# Patient Record
Sex: Male | Born: 1955 | Race: White | Hispanic: No | Marital: Married | Smoking: Former smoker
Health system: Southern US, Community
[De-identification: ages and names within clinical notes are randomized; demographics above are authoritative.]

## PROBLEM LIST (undated history)

## (undated) DIAGNOSIS — R42 Dizziness and giddiness: Secondary | ICD-10-CM

## (undated) DIAGNOSIS — H8110 Benign paroxysmal vertigo, unspecified ear: Secondary | ICD-10-CM

## (undated) DIAGNOSIS — K449 Diaphragmatic hernia without obstruction or gangrene: Secondary | ICD-10-CM

## (undated) DIAGNOSIS — I10 Essential (primary) hypertension: Secondary | ICD-10-CM

## (undated) DIAGNOSIS — M109 Gout, unspecified: Secondary | ICD-10-CM

## (undated) HISTORY — PX: TONSILLECTOMY: SUR1361

## (undated) HISTORY — DX: Benign paroxysmal vertigo, unspecified ear: H81.10

---

## 1999-07-18 ENCOUNTER — Encounter: Payer: Self-pay | Admitting: Family Medicine

## 1999-07-18 ENCOUNTER — Ambulatory Visit (HOSPITAL_COMMUNITY): Admission: RE | Admit: 1999-07-18 | Discharge: 1999-07-18 | Payer: Self-pay | Admitting: Family Medicine

## 2000-03-12 ENCOUNTER — Other Ambulatory Visit: Admission: RE | Admit: 2000-03-12 | Discharge: 2000-03-12 | Payer: Self-pay | Admitting: Otolaryngology

## 2000-03-12 ENCOUNTER — Encounter (INDEPENDENT_AMBULATORY_CARE_PROVIDER_SITE_OTHER): Payer: Self-pay | Admitting: Specialist

## 2002-11-19 ENCOUNTER — Encounter: Payer: Self-pay | Admitting: Emergency Medicine

## 2002-11-19 ENCOUNTER — Emergency Department (HOSPITAL_COMMUNITY): Admission: EM | Admit: 2002-11-19 | Discharge: 2002-11-19 | Payer: Self-pay | Admitting: Emergency Medicine

## 2003-10-23 ENCOUNTER — Ambulatory Visit (HOSPITAL_COMMUNITY): Admission: RE | Admit: 2003-10-23 | Discharge: 2003-10-23 | Payer: Self-pay | Admitting: Family Medicine

## 2007-07-27 ENCOUNTER — Encounter: Admission: RE | Admit: 2007-07-27 | Discharge: 2007-07-27 | Payer: Self-pay | Admitting: Family Medicine

## 2008-11-03 ENCOUNTER — Encounter: Admission: RE | Admit: 2008-11-03 | Discharge: 2008-11-03 | Payer: Self-pay | Admitting: Orthopedic Surgery

## 2010-07-28 ENCOUNTER — Encounter: Payer: Self-pay | Admitting: Family Medicine

## 2011-10-27 ENCOUNTER — Other Ambulatory Visit: Payer: Self-pay | Admitting: Family Medicine

## 2011-10-27 DIAGNOSIS — M542 Cervicalgia: Secondary | ICD-10-CM

## 2011-10-30 ENCOUNTER — Ambulatory Visit
Admission: RE | Admit: 2011-10-30 | Discharge: 2011-10-30 | Disposition: A | Discharge status: Home / Self Care | Payer: BC Managed Care – PPO | Source: Ambulatory Visit | Attending: Family Medicine | Admitting: Family Medicine

## 2011-10-30 DIAGNOSIS — M542 Cervicalgia: Secondary | ICD-10-CM

## 2015-02-18 ENCOUNTER — Encounter (HOSPITAL_COMMUNITY): Payer: Self-pay

## 2015-02-18 ENCOUNTER — Emergency Department (HOSPITAL_COMMUNITY)
Admission: EM | Admit: 2015-02-18 | Discharge: 2015-02-19 | Disposition: A | Discharge status: Home / Self Care | Payer: 59 | Attending: Emergency Medicine | Admitting: Emergency Medicine

## 2015-02-18 DIAGNOSIS — Z8739 Personal history of other diseases of the musculoskeletal system and connective tissue: Secondary | ICD-10-CM | POA: Diagnosis not present

## 2015-02-18 DIAGNOSIS — R109 Unspecified abdominal pain: Secondary | ICD-10-CM | POA: Diagnosis present

## 2015-02-18 DIAGNOSIS — K219 Gastro-esophageal reflux disease without esophagitis: Secondary | ICD-10-CM | POA: Insufficient documentation

## 2015-02-18 DIAGNOSIS — I10 Essential (primary) hypertension: Secondary | ICD-10-CM | POA: Insufficient documentation

## 2015-02-18 DIAGNOSIS — R079 Chest pain, unspecified: Secondary | ICD-10-CM | POA: Insufficient documentation

## 2015-02-18 HISTORY — DX: Dizziness and giddiness: R42

## 2015-02-18 HISTORY — DX: Diaphragmatic hernia without obstruction or gangrene: K44.9

## 2015-02-18 HISTORY — DX: Gout, unspecified: M10.9

## 2015-02-18 HISTORY — DX: Essential (primary) hypertension: I10

## 2015-02-18 NOTE — ED Provider Notes (Signed)
CSN: [id number]   Arrival date & time 02/18/15 2346  History  This chart was scribed for  Marry Kusch, MD by Altamease Oiler, ED Scribe. This patient was seen in room A02C/A02C and the patient's care was started at 12:04 AM.  Chief Complaint  Patient presents with  . Abdominal Pain  . Chest Pain    HPI Patient is a 59 y.o. male presenting with chest pain. The history is provided by the patient. No language interpreter was used.  Chest Pain Chest pain location: Central chest. Pain quality: pressure   Pain radiates to:  Does not radiate Pain radiates to the back: no   Pain severity:  Moderate Onset quality:  Sudden Duration:  2 hours Timing:  Intermittent Progression:  Improving Chronicity:  New Context: eating   Relieved by:  Nothing Worsened by:  Nothing tried Associated symptoms: abdominal pain   Associated symptoms: no nausea, no palpitations, no shortness of breath and not vomiting   Risk factors: male sex    Richard Mccann is a 59 y.o. male with PMHx of hiatal hernia who presents to the Emergency Department complaining of central chest pain with sudden tonight around 10 PM. Pt ate BBQ wings, a salad, and a hamburger with cheese and bacon tonight around 7:30 PM. He notes that after eating he went home and was preparing for bed by taking an Advil Cold/Sinus for ongoing headaches and the pain started while he was swallowing water. He states that the pain "felt like my hernia" and describes it as intermittent pressure. He attempted to force emesis to relieve the pressure but was only able to pass clear mucous. Pt uses Prilosec daily. Associated symptoms include abdominal bloating and epigastric pain. Pt denies constipation but states that he has had some difficulty with bowel movements over the last several days.   Past Medical History  Diagnosis Date  . Gout   . Hiatal hernia   . Hypertension   . Vertigo     Past Surgical History  Procedure Laterality Date  . Tonsillectomy       History reviewed. No pertinent family history.  Social History  Substance Use Topics  . Smoking status: Never Smoker   . Smokeless tobacco: Never Used  . Alcohol Use: No     Review of Systems  Respiratory: Negative for shortness of breath.   Cardiovascular: Positive for chest pain. Negative for palpitations and leg swelling.  Gastrointestinal: Positive for abdominal pain. Negative for nausea, vomiting and constipation.  All other systems reviewed and are negative.    Home Medications   Prior to Admission medications   Not on File    Allergies  Allopurinol  Triage Vitals: BP 133/78 mmHg  Pulse 62  Temp(Src) 98 F (36.7 C)  Resp 19  Ht 5\' 7"  (1.702 m)  Wt 200 lb (90.719 kg)  BMI 31.32 kg/m2  SpO2 96%  Physical Exam  Constitutional: He is oriented to person, place, and time. He appears well-developed and well-nourished.  HENT:  Head: Normocephalic.  Mouth/Throat: Oropharynx is clear and moist. No oropharyngeal exudate.  Moist mucous membranes  Eyes: EOM are normal. Pupils are equal, round, and reactive to light.  Neck: Normal range of motion. Neck supple.  Cardiovascular: Normal rate and regular rhythm.   Pulmonary/Chest: Effort normal and breath sounds normal. No respiratory distress. He has no wheezes. He has no rales.  Abdominal: Soft. He exhibits distension. He exhibits no mass. Bowel sounds are increased. There is no tenderness. There is  no rebound and no guarding.  Abdominal sounds into the thoracic cavity  Musculoskeletal: Normal range of motion.  Neurological: He is alert and oriented to person, place, and time.  Skin: Skin is warm and dry.  Psychiatric: He has a normal mood and affect.  Nursing note and vitals reviewed.   ED Course  Procedures   DIAGNOSTIC STUDIES: Oxygen Saturation is 96% on RA, normal by my interpretation.    COORDINATION OF CARE: 12:10 AM Discussed treatment plan which includes CXR, abdominal XR, EKG, lab work, and GI  cocktail  with pt at bedside and pt agreed to plan.  Labs Review-  Labs Reviewed  I-STAT CHEM 8, ED - Abnormal; Notable for the following:    BUN 26 (*)    Creatinine, Ser 1.50 (*)    Glucose, Bld 120 (*)    All other components within normal limits  CBC WITH DIFFERENTIAL/PLATELET  BASIC METABOLIC PANEL  CBC  HEPATIC FUNCTION PANEL  LIPASE, BLOOD  URINALYSIS, ROUTINE W REFLEX MICROSCOPIC (NOT AT [facility])  I-STAT TROPOININ, ED    Imaging Review No results found.  EKG Interpretation  Date/Time:  Monday February 19 2015 00:43:20 EDT Ventricular Rate:  55 PR Interval:  151 QRS Duration: 78 QT Interval:  412 QTC Calculation: 394 R Axis:   63 Text Interpretation:  Sinus rhythm Confirmed by [facility]  MD, Revanth Neidig ([id number]) on 02/19/2015 12:48:00 AM       MDM   Symptoms consistent with GERD, abdomen is no longer distended and patient feels markedly improved post medication.  2 negative troponins with a negative EKG ruled out for MI.  Will start carafate/ bland diet and have patient follow up with PMD and GI strict return precautions given.    I personally performed the services described in this documentation, which was scribed in my presence. The recorded information has been reviewed and is accurate.      Veatrice Kells, MD 02/19/15 0330

## 2015-02-18 NOTE — ED Notes (Addendum)
Pt comes from home via [facility], c/o upper abd pain and CP , starting about an hour ago, nausea and dry heaves, and clear sputum.  Hx of HTN, gout and hiatal hernia. PTA received 4mg  of zofran, 324 ASA

## 2015-02-19 ENCOUNTER — Emergency Department (HOSPITAL_COMMUNITY): Payer: 59

## 2015-02-19 ENCOUNTER — Encounter (HOSPITAL_COMMUNITY): Payer: Self-pay | Admitting: Emergency Medicine

## 2015-02-19 LAB — BASIC METABOLIC PANEL WITH GFR
Anion gap: 10 (ref 5–15)
BUN: 21 mg/dL — ABNORMAL HIGH (ref 6–20)
CO2: 21 mmol/L — ABNORMAL LOW (ref 22–32)
Calcium: 8.7 mg/dL — ABNORMAL LOW (ref 8.9–10.3)
Chloride: 106 mmol/L (ref 101–111)
Creatinine, Ser: 1.53 mg/dL — ABNORMAL HIGH (ref 0.61–1.24)
GFR calc Af Amer: 56 mL/min — ABNORMAL LOW
GFR calc non Af Amer: 48 mL/min — ABNORMAL LOW
Glucose, Bld: 120 mg/dL — ABNORMAL HIGH (ref 65–99)
Potassium: 3.5 mmol/L (ref 3.5–5.1)
Sodium: 137 mmol/L (ref 135–145)

## 2015-02-19 LAB — I-STAT CHEM 8, ED
BUN: 26 mg/dL — ABNORMAL HIGH (ref 6–20)
Calcium, Ion: 1.15 mmol/L (ref 1.12–1.23)
Chloride: 105 mmol/L (ref 101–111)
Creatinine, Ser: 1.5 mg/dL — ABNORMAL HIGH (ref 0.61–1.24)
Glucose, Bld: 120 mg/dL — ABNORMAL HIGH (ref 65–99)
HCT: 44 % (ref 39.0–52.0)
Hemoglobin: 15 g/dL (ref 13.0–17.0)
Potassium: 3.6 mmol/L (ref 3.5–5.1)
Sodium: 139 mmol/L (ref 135–145)
TCO2: 21 mmol/L (ref 0–100)

## 2015-02-19 LAB — URINALYSIS, ROUTINE W REFLEX MICROSCOPIC
Bilirubin Urine: NEGATIVE
Glucose, UA: NEGATIVE mg/dL
Hgb urine dipstick: NEGATIVE
Ketones, ur: NEGATIVE mg/dL
Leukocytes, UA: NEGATIVE
Nitrite: NEGATIVE
Protein, ur: NEGATIVE mg/dL
Specific Gravity, Urine: 1.025 (ref 1.005–1.030)
Urobilinogen, UA: 1 mg/dL (ref 0.0–1.0)
pH: 5.5 (ref 5.0–8.0)

## 2015-02-19 LAB — I-STAT TROPONIN, ED
Troponin i, poc: 0 ng/mL (ref 0.00–0.08)
Troponin i, poc: 0 ng/mL (ref 0.00–0.08)

## 2015-02-19 LAB — CBC WITH DIFFERENTIAL/PLATELET
BASOS PCT: 0 % (ref 0–1)
Basophils Absolute: 0 10*3/uL (ref 0.0–0.1)
Eosinophils Absolute: 0.1 10*3/uL (ref 0.0–0.7)
Eosinophils Relative: 2 % (ref 0–5)
HEMATOCRIT: 42.1 % (ref 39.0–52.0)
HEMOGLOBIN: 14.5 g/dL (ref 13.0–17.0)
LYMPHS ABS: 1.8 10*3/uL (ref 0.7–4.0)
LYMPHS PCT: 24 % (ref 12–46)
MCH: 31.5 pg (ref 26.0–34.0)
MCHC: 34.4 g/dL (ref 30.0–36.0)
MCV: 91.3 fL (ref 78.0–100.0)
MONOS PCT: 6 % (ref 3–12)
Monocytes Absolute: 0.5 10*3/uL (ref 0.1–1.0)
NEUTROS ABS: 5.3 10*3/uL (ref 1.7–7.7)
NEUTROS PCT: 68 % (ref 43–77)
Platelets: 189 10*3/uL (ref 150–400)
RBC: 4.61 MIL/uL (ref 4.22–5.81)
RDW: 13.5 % (ref 11.5–15.5)
WBC: 7.8 10*3/uL (ref 4.0–10.5)

## 2015-02-19 LAB — HEPATIC FUNCTION PANEL
ALT: 28 U/L (ref 17–63)
AST: 30 U/L (ref 15–41)
Albumin: 3.6 g/dL (ref 3.5–5.0)
Alkaline Phosphatase: 55 U/L (ref 38–126)
BILIRUBIN TOTAL: 0.5 mg/dL (ref 0.3–1.2)
Total Protein: 6.4 g/dL — ABNORMAL LOW (ref 6.5–8.1)

## 2015-02-19 LAB — LIPASE, BLOOD: Lipase: 28 U/L (ref 22–51)

## 2015-02-19 MED ORDER — SUCRALFATE 1 GM/10ML PO SUSP: 1.0000 g | Freq: Three times a day (TID) | ORAL | Status: DC

## 2015-02-19 MED ORDER — METOCLOPRAMIDE HCL 5 MG/ML IJ SOLN
10.0000 mg | Freq: Once | INTRAMUSCULAR | Status: AC
  Administered 2015-02-19: 10 mg via INTRAVENOUS
  Filled 2015-02-19: qty 2

## 2015-02-19 MED ORDER — GI COCKTAIL ~~LOC~~
30.0000 mL | Freq: Once | ORAL | Status: AC
  Administered 2015-02-19: 30 mL via ORAL
  Filled 2015-02-19: qty 30

## 2015-02-19 MED ORDER — KETOROLAC TROMETHAMINE 30 MG/ML IJ SOLN
30.0000 mg | Freq: Once | INTRAMUSCULAR | Status: AC
  Administered 2015-02-19: 30 mg via INTRAVENOUS
  Filled 2015-02-19: qty 1

## 2015-03-02 ENCOUNTER — Other Ambulatory Visit: Payer: Self-pay | Admitting: Gastroenterology

## 2015-03-02 DIAGNOSIS — R1013 Epigastric pain: Secondary | ICD-10-CM

## 2015-03-20 ENCOUNTER — Other Ambulatory Visit: Payer: Self-pay | Admitting: Gastroenterology

## 2015-03-20 ENCOUNTER — Ambulatory Visit
Admission: RE | Admit: 2015-03-20 | Discharge: 2015-03-20 | Disposition: A | Discharge status: Home / Self Care | Payer: 59 | Source: Ambulatory Visit | Attending: Gastroenterology | Admitting: Gastroenterology

## 2015-03-20 DIAGNOSIS — R1013 Epigastric pain: Secondary | ICD-10-CM

## 2015-09-05 ENCOUNTER — Other Ambulatory Visit: Payer: Self-pay | Admitting: Family Medicine

## 2015-09-05 DIAGNOSIS — R1032 Left lower quadrant pain: Secondary | ICD-10-CM

## 2015-09-07 ENCOUNTER — Ambulatory Visit
Admission: RE | Admit: 2015-09-07 | Discharge: 2015-09-07 | Disposition: A | Discharge status: Home / Self Care | Payer: 59 | Source: Ambulatory Visit | Attending: Family Medicine | Admitting: Family Medicine

## 2015-09-07 DIAGNOSIS — R1032 Left lower quadrant pain: Secondary | ICD-10-CM

## 2015-09-07 MED ORDER — IOPAMIDOL (ISOVUE-300) INJECTION 61%
125.0000 mL | Freq: Once | INTRAVENOUS | Status: AC | PRN
  Administered 2015-09-07: 125 mL via INTRAVENOUS

## 2016-01-15 ENCOUNTER — Emergency Department (HOSPITAL_COMMUNITY): Payer: 59

## 2016-01-15 ENCOUNTER — Emergency Department (HOSPITAL_COMMUNITY)
Admission: EM | Admit: 2016-01-15 | Discharge: 2016-01-15 | Disposition: A | Discharge status: Home / Self Care | Payer: 59 | Attending: Emergency Medicine | Admitting: Emergency Medicine

## 2016-01-15 ENCOUNTER — Encounter (HOSPITAL_COMMUNITY): Payer: Self-pay

## 2016-01-15 DIAGNOSIS — Z79899 Other long term (current) drug therapy: Secondary | ICD-10-CM | POA: Diagnosis not present

## 2016-01-15 DIAGNOSIS — Z7982 Long term (current) use of aspirin: Secondary | ICD-10-CM | POA: Diagnosis not present

## 2016-01-15 DIAGNOSIS — I10 Essential (primary) hypertension: Secondary | ICD-10-CM | POA: Diagnosis not present

## 2016-01-15 DIAGNOSIS — R208 Other disturbances of skin sensation: Secondary | ICD-10-CM | POA: Diagnosis not present

## 2016-01-15 DIAGNOSIS — R200 Anesthesia of skin: Secondary | ICD-10-CM | POA: Diagnosis not present

## 2016-01-15 LAB — CBC
HEMATOCRIT: 45.5 % (ref 39.0–52.0)
Hemoglobin: 15.5 g/dL (ref 13.0–17.0)
MCH: 30.8 pg (ref 26.0–34.0)
MCHC: 34.1 g/dL (ref 30.0–36.0)
MCV: 90.5 fL (ref 78.0–100.0)
Platelets: 197 10*3/uL (ref 150–400)
RBC: 5.03 MIL/uL (ref 4.22–5.81)
RDW: 13.1 % (ref 11.5–15.5)
WBC: 6.8 10*3/uL (ref 4.0–10.5)

## 2016-01-15 LAB — COMPREHENSIVE METABOLIC PANEL
ALBUMIN: 4.2 g/dL (ref 3.5–5.0)
ALT: 23 U/L (ref 17–63)
AST: 21 U/L (ref 15–41)
Alkaline Phosphatase: 61 U/L (ref 38–126)
Anion gap: 8 (ref 5–15)
BILIRUBIN TOTAL: 0.7 mg/dL (ref 0.3–1.2)
BUN: 19 mg/dL (ref 6–20)
CO2: 21 mmol/L — ABNORMAL LOW (ref 22–32)
Calcium: 9.5 mg/dL (ref 8.9–10.3)
Chloride: 108 mmol/L (ref 101–111)
Creatinine, Ser: 1.3 mg/dL — ABNORMAL HIGH (ref 0.61–1.24)
GFR calc Af Amer: >60 mL/min (ref >60)
GFR calc non Af Amer: 58 mL/min — ABNORMAL LOW (ref >60)
GLUCOSE: 93 mg/dL (ref 65–99)
POTASSIUM: 4.1 mmol/L (ref 3.5–5.1)
Sodium: 137 mmol/L (ref 135–145)
TOTAL PROTEIN: 7.2 g/dL (ref 6.5–8.1)

## 2016-01-15 LAB — URINALYSIS, ROUTINE W REFLEX MICROSCOPIC
BILIRUBIN URINE: NEGATIVE
GLUCOSE, UA: NEGATIVE mg/dL
HGB URINE DIPSTICK: NEGATIVE
Ketones, ur: NEGATIVE mg/dL
Leukocytes, UA: NEGATIVE
NITRITE: NEGATIVE
Protein, ur: NEGATIVE mg/dL
SPECIFIC GRAVITY, URINE: 1.014 (ref 1.005–1.030)
pH: 5.5 (ref 5.0–8.0)

## 2016-01-15 LAB — LIPASE, BLOOD: Lipase: 28 U/L (ref 11–51)

## 2016-01-15 MED ORDER — GADOBENATE DIMEGLUMINE 529 MG/ML IV SOLN
20.0000 mL | Freq: Once | INTRAVENOUS | Status: AC | PRN
  Administered 2016-01-15: 20 mL via INTRAVENOUS

## 2016-01-15 MED ORDER — ASPIRIN 81 MG PO CHEW: 81.0000 mg | CHEWABLE_TABLET | Freq: Every day | ORAL | Status: DC

## 2016-01-15 MED ORDER — ASPIRIN 81 MG PO CHEW
81.0000 mg | CHEWABLE_TABLET | Freq: Once | ORAL | Status: AC
  Administered 2016-01-15: 81 mg via ORAL
  Filled 2016-01-15: qty 1

## 2016-01-15 NOTE — ED Notes (Signed)
Pt requested something to eat, MD agreed but is now bedside, pt can be discharged.

## 2016-01-15 NOTE — Discharge Instructions (Signed)
Paresthesia Paresthesia is an abnormal burning or prickling sensation. This sensation is generally felt in the hands, arms, legs, or feet. However, it may occur in any part of the body. Usually, it is not painful. The feeling may be described as:  Tingling or numbness.  Pins and needles.  Skin crawling.  Buzzing.  Limbs falling asleep.  Itching. Most people experience temporary (transient) paresthesia at some time in their lives. Paresthesia may occur when you breathe too quickly (hyperventilation). It can also occur without any apparent cause. Commonly, paresthesia occurs when pressure is placed on a nerve. The sensation quickly goes away after the pressure is removed. For some people, however, paresthesia is a long-lasting (chronic) condition that is caused by an underlying disorder. If you continue to have paresthesia, you may need further medical evaluation. HOME CARE INSTRUCTIONS Watch your condition for any changes. Taking the following actions may help to lessen any discomfort that you are feeling:  Avoid drinking alcohol.  Try acupuncture or massage to help relieve your symptoms.  Keep all follow-up visits as directed by your health care provider. This is important. SEEK MEDICAL CARE IF:  You continue to have episodes of paresthesia.  Your burning or prickling feeling gets worse when you walk.  You have pain, cramps, or dizziness.  You develop a rash. SEEK IMMEDIATE MEDICAL CARE IF:  You feel weak.  You have trouble walking or moving.  You have problems with speech, understanding, or vision.  You feel confused.  You cannot control your bladder or bowel movements.  You have numbness after an injury.  You faint.   This information is not intended to replace advice given to you by your health care provider. Make sure you discuss any questions you have with your health care provider.   Document Released: 06/13/2002 Document Revised: 11/07/2014 Document Reviewed:  06/19/2014 Elsevier Interactive Patient Education 2016 [employer]  

## 2016-01-15 NOTE — ED Notes (Signed)
Patient here with left sided facial numbness and blurred vision intermittently x 1 month. Also has ongoing left sided abdominal pain, has been scanned for same and no diagnosis, complains of intermittent constipation.  Alert and oriented

## 2016-01-15 NOTE — ED Notes (Signed)
When asked to allow discharge vitals pt stated "I'm too hungry".

## 2016-01-15 NOTE — ED Provider Notes (Signed)
CSN: [id number]     Arrival date & time 01/15/16  1051 History   First MD Initiated Contact with Patient 01/15/16 1509     Chief Complaint  Patient presents with  . face numbness   . left side pain      (Consider location/radiation/quality/duration/timing/severity/associated sxs/prior Treatment) The history is provided by the patient and the spouse.  Patient is a 60 year old male with history of hypertension, gout, and vertigo presenting today for 2 days of unusual sensation on the left side of his face he describes as numbness. He states he still has feeling on the left side of his face but it just feels different, as if there is a sinus pressure radiating throughout his face. In the same time he states he has had associated blurred left-sided vision. He has not had any new recent vertigo, imbalance, nausea/vomiting, or weakness. He was recently diagnosed with hypocalcemia and has been taking supplemental vitamin D and calcium.  He has not tried any specific therapies for this sensation. He endorses significant postnasal drip with associated cough with clear sputum over the last 2-3 months. He denies any fevers, chills, chest pain, or shortness of breath.  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 He endorses daily dehydration that takes 2-3 hours drinking clear fluids at home to recover from.  He also states he's had left-sided abdominal pain that seems to improve with eating and bowel movements. He has alternating constipation and diarrhea. He has had extensive workup for similar pain in the left lower quadrant including CT scan and colonoscopy that did not show any obvious source of his discomfort. His primary concern is not the abdominal pain, but the facial numbness. He thought the abdominal pain could potentially be related to his facial discomfort.  Past Medical History  Diagnosis Date  . Gout   . Hiatal hernia   .  Hypertension   . Vertigo    Past Surgical History  Procedure Laterality Date  . Tonsillectomy     No family history on file. Social History  Substance Use Topics  . Smoking status: Never Smoker   . Smokeless tobacco: Never Used  . Alcohol Use: No    Review of Systems  Constitutional: Negative for fever, chills, diaphoresis and fatigue.  HENT: Positive for congestion, postnasal drip and sinus pressure. Negative for tinnitus and trouble swallowing.   Eyes: Negative for visual disturbance.  Respiratory: Negative for cough, chest tightness and shortness of breath.   Cardiovascular: Negative for chest pain.  Gastrointestinal: Positive for abdominal pain, diarrhea and constipation. Negative for nausea, blood in stool and abdominal distention.  Genitourinary: Negative for dysuria and flank pain.  Musculoskeletal: Negative for neck pain.  Skin: Negative for rash.  Neurological: Negative for dizziness, syncope, facial asymmetry, speech difficulty, weakness, light-headedness, numbness and headaches.  Psychiatric/Behavioral: Negative for confusion and agitation.      Allergies  Diclofenac; Shellfish allergy; Allopurinol; and Amoxicillin  Home Medications   Prior to Admission medications   Medication Sig Start Date End Date Taking? Authorizing Provider  cholecalciferol (VITAMIN D) 1000 units tablet Take 5,000 Units by mouth daily.   Yes Historical Provider, MD  diazepam (VALIUM) 5 MG tablet Take 5 mg by mouth 3 (three) times daily as needed for anxiety.   Yes Historical Provider, MD  Febuxostat (ULORIC) 80 MG TABS Take 40 mg by mouth daily.   Yes Historical Provider, MD  lisinopril (PRINIVIL,ZESTRIL) 20 MG  tablet Take 20 mg by mouth every evening. 01/09/16  Yes Historical Provider, MD  omeprazole (PRILOSEC) 40 MG capsule Take 40 mg by mouth daily.   Yes Historical Provider, MD  aspirin 81 MG chewable tablet Chew 1 tablet (81 mg total) by mouth daily. 01/15/16   Allie Bossier, MD   sucralfate (CARAFATE) 1 GM/10ML suspension Take 10 mLs (1 g total) by mouth 4 (four) times daily -  with meals and at bedtime. Patient not taking: Reported on 01/15/2016 02/19/15   April Palumbo, MD   BP 131/85 mmHg  Pulse 51  Temp(Src) 98.3 F (36.8 C) (Oral)  Resp 18  Ht 5\' 7"  (1.702 m)  Wt 88.451 kg  BMI 30.53 kg/m2  SpO2 96% Physical Exam  Constitutional: He is oriented to person, place, and time. He appears well-developed and well-nourished. No distress.  HENT:  Head: Normocephalic and atraumatic.  Mouth/Throat: No oropharyngeal exudate.  No frontal or maxillary sinus tenderness bilaterally  Eyes: Conjunctivae are normal.  Cardiovascular: Normal rate and normal heart sounds.   No murmur heard. Pulmonary/Chest: Effort normal and breath sounds normal.  Abdominal: Soft. There is no tenderness.  Musculoskeletal: He exhibits no edema.  Neurological: He is alert and oriented to person, place, and time. No cranial nerve deficit. He exhibits normal muscle tone. Coordination and gait normal. GCS eye subscore is 4. GCS verbal subscore is 5. GCS motor subscore is 6.  20/25 vision bilaterally  Skin: Skin is warm. He is not diaphoretic.  Psychiatric: He has a normal mood and affect. His behavior is normal.  Nursing note and vitals reviewed.   ED Course  Procedures (including critical care time) Labs Review Labs Reviewed  COMPREHENSIVE METABOLIC PANEL - Abnormal; Notable for the following:    CO2 21 (*)    Creatinine, Ser 1.30 (*)    GFR calc non Af Amer 58 (*)    All other components within normal limits  LIPASE, BLOOD  CBC  URINALYSIS, ROUTINE W REFLEX MICROSCOPIC (NOT AT [facility])    Imaging Review Ct Head Wo Contrast  01/15/2016  CLINICAL DATA:  Left-sided facial pain and pressure with headaches EXAM: CT HEAD WITHOUT CONTRAST CT MAXILLOFACIAL WITHOUT CONTRAST TECHNIQUE: Multidetector CT imaging of the head and maxillofacial structures were performed using the standard protocol  without intravenous contrast. Multiplanar CT image reconstructions of the maxillofacial structures were also generated. COMPARISON:  None. FINDINGS: CT HEAD FINDINGS The bony calvarium is intact. No findings to suggest acute hemorrhage, acute infarction or space-occupying mass lesion are noted. CT MAXILLOFACIAL FINDINGS The bony structures are within normal limits. Paranasal sinuses are well aerated without focal abnormality. The mastoid air cells are also well aerated. The surrounding soft tissue structures show no focal abnormality. The orbits and their contents are within normal limits. IMPRESSION: CT of the head:  No acute intracranial abnormality noted. CT the maxillofacial bones:  No acute abnormality noted. Electronically Signed   By: Inez Catalina M.D.   On: 01/15/2016 16:28   Mr Jeri Cos [id number] Contrast  01/15/2016  CLINICAL DATA:  Left facial numbness.  Hypertension and smoking EXAM: MRI HEAD WITHOUT AND WITH CONTRAST TECHNIQUE: Multiplanar, multiecho pulse sequences of the brain and surrounding structures were obtained without and with intravenous contrast. CONTRAST:  55mL MULTIHANCE GADOBENATE DIMEGLUMINE 529 MG/ML IV SOLN COMPARISON:  CT head 01/15/2016 FINDINGS: Ventricle size normal.  Cerebral volume normal for age Negative for acute infarct. Hyperintensity left frontal periventricular white matter most compatible with chronic ischemia. Basal ganglia and brainstem  normal. Scattered small cerebral white matter hyperintensities bilaterally. Negative for intracranial hemorrhage. Negative for mass or edema.  No shift of the midline structures. Pituitary normal in size. Normal orbital contents. Paranasal sinuses clear. Calvarium and skullbase within normal limits. IMPRESSION: Negative for acute abnormality Small area of chronic ischemia left frontal white matter. Electronically Signed   By: Franchot Gallo M.D.   On: 01/15/2016 18:53   Ct Maxillofacial Wo Cm  01/15/2016  CLINICAL DATA:  Left-sided facial  pain and pressure with headaches EXAM: CT HEAD WITHOUT CONTRAST CT MAXILLOFACIAL WITHOUT CONTRAST TECHNIQUE: Multidetector CT imaging of the head and maxillofacial structures were performed using the standard protocol without intravenous contrast. Multiplanar CT image reconstructions of the maxillofacial structures were also generated. COMPARISON:  None. FINDINGS: CT HEAD FINDINGS The bony calvarium is intact. No findings to suggest acute hemorrhage, acute infarction or space-occupying mass lesion are noted. CT MAXILLOFACIAL FINDINGS The bony structures are within normal limits. Paranasal sinuses are well aerated without focal abnormality. The mastoid air cells are also well aerated. The surrounding soft tissue structures show no focal abnormality. The orbits and their contents are within normal limits. IMPRESSION: CT of the head:  No acute intracranial abnormality noted. CT the maxillofacial bones:  No acute abnormality noted. Electronically Signed   By: Inez Catalina M.D.   On: 01/15/2016 16:28   I have personally reviewed and evaluated these images and lab results as part of my medical decision-making.   EKG Interpretation None      MDM   Final diagnoses:  Left facial numbness   Patient presents with left facial numbness with change in vision. Vision 20/25 bilaterally (corrected). Neuro exam non focal - patient states he can feel light touch on the left face but it feels different than the right face. CT head and face show no acute intracranial or maxillo-facial process. MRI brain shows no acute process with chronic left frontal ischemia. Patient informed of findings and seen by neurology who recommends follow up with outpatient neurology regarding symptoms. Possible symptoms are secondary to recent diagnosis of hypocalcemia (Ca normal today, patient on supplemental Vit D and Ca) or atypical migraine. Patient was dishcarged home with return precautions in good condition.   Allie Bossier,  MD 01/16/16 [place], MD 01/18/16 [phone]

## 2016-01-15 NOTE — ED Notes (Signed)
Neurology at bedside.

## 2016-01-15 NOTE — Consult Note (Signed)
NEURO HOSPITALIST CONSULT NOTE    HPI:                                                                                                                                          Richard Mccann is an 60 y.o. male with history of hypertension And tobacco dependence came in with unusual sensation on the left side of his face he describes as numbness. His symptoms started 2 days ago when he woke up with it. He states he still has feeling on the left side of his face but it just feels different, as if there is a sinus pressure radiating throughout his face. In the same time he states he has had associated blurred left-sided vision. He has not had any new recent vertigo, imbalance, nausea/vomiting, or weakness. He was recently diagnosed with hypocalcemia and has been taking supplemental vitamin D and calcium. Hospital workup including CT scan of the head and face are within normal limits, MRI brain is negative for stroke, personally reviewed. He denies any chest pain palpitation headache fever chills no change in bowel or urinary habits  Past Medical History  Diagnosis Date  . Gout   . Hiatal hernia   . Hypertension   . Vertigo     Past Surgical History  Procedure Laterality Date  . Tonsillectomy      Family History: Hypertension  Social History: Smokes cigarette  Allergies  Allergen Reactions  . Diclofenac Diarrhea  . Shellfish Allergy Other (See Comments)    Patient states that he "cannot eat it because of"his "arthritis" and gout  . Allopurinol Rash  . Amoxicillin Hives and Rash   Home Medications   Prior to Admission medications   Medication Sig Start Date End Date Taking? Authorizing Provider  diazepam (VALIUM) 5 MG tablet Take 5 mg by mouth 3 (three) times daily as needed for anxiety.    Historical Provider, MD  Febuxostat (ULORIC) 80 MG TABS Take 40 mg by mouth daily.    Historical Provider, MD  lisinopril (PRINIVIL,ZESTRIL) 10 MG  tablet Take 10 mg by mouth daily.    Historical Provider, MD  omeprazole (PRILOSEC) 40 MG capsule Take 40 mg by mouth daily.    Historical Provider, MD  sucralfate (CARAFATE) 1 GM/10ML suspension Take 10 mLs (1 g total) by mouth 4 (four) times daily - with meals and at bedtime.              ROS:  As mentioned in history of present illness   Blood pressure 131/85, pulse 51, temperature 98.3 F (36.8 C), temperature source Oral, resp. rate 18, height 5\' 7"  (1.702 m), weight 88.451 kg (195 lb), SpO2 96 %.   Examination  Gen. lying in bed and appears comfortable Neck supple without any lymphadenopathy Lungs clear to auscultation CVS S1-S2 regular Psychiatric normal and side Skin no signs or scars or bruises Neurologic alert and oriented 3 Cranial nerves II-12 intact except subjective feeling of numbness in the left side of the face. Motor exam did not reveal any focal weakness deep tendon reflexes are +1 Sensations are intact Finger to nose test is normal Speech is fluent Gait was not assessed    Lab Results: Basic Metabolic Panel:  Recent Labs Lab 01/15/16 1105  NA 137  K 4.1  CL 108  CO2 21*  GLUCOSE 93  BUN 19  CREATININE 1.30*  CALCIUM 9.5    Liver Function Tests:  Recent Labs Lab 01/15/16 1105  AST 21  ALT 23  ALKPHOS 61  BILITOT 0.7  PROT 7.2  ALBUMIN 4.2    Recent Labs Lab 01/15/16 1105  LIPASE 28   No results for input(s): AMMONIA in the last 168 hours.  CBC:  Recent Labs Lab 01/15/16 1105  WBC 6.8  HGB 15.5  HCT 45.5  MCV 90.5  PLT 197    Cardiac Enzymes: No results for input(s): CKTOTAL, CKMB, CKMBINDEX, TROPONINI in the last 168 hours.  Lipid Panel: No results for input(s): CHOL, TRIG, HDL, CHOLHDL, VLDL, LDLCALC in the last 168 hours.  CBG: No results for input(s): GLUCAP  in the last 168 hours.  Microbiology: No results found for this or any previous visit.  Coagulation Studies: No results for input(s): LABPROT, INR in the last 72 hours.  Imaging: Ct Head Wo Contrast  01/15/2016  CLINICAL DATA:  Left-sided facial pain and pressure with headaches EXAM: CT HEAD WITHOUT CONTRAST CT MAXILLOFACIAL WITHOUT CONTRAST TECHNIQUE: Multidetector CT imaging of the head and maxillofacial structures were performed using the standard protocol without intravenous contrast. Multiplanar CT image reconstructions of the maxillofacial structures were also generated. COMPARISON:  None. FINDINGS: CT HEAD FINDINGS The bony calvarium is intact. No findings to suggest acute hemorrhage, acute infarction or space-occupying mass lesion are noted. CT MAXILLOFACIAL FINDINGS The bony structures are within normal limits. Paranasal sinuses are well aerated without focal abnormality. The mastoid air cells are also well aerated. The surrounding soft tissue structures show no focal abnormality. The orbits and their contents are within normal limits. IMPRESSION: CT of the head:  No acute intracranial abnormality noted. CT the maxillofacial bones:  No acute abnormality noted. Electronically Signed   By: Inez Catalina M.D.   On: 01/15/2016 16:28   Ct Maxillofacial Wo Cm  01/15/2016  CLINICAL DATA:  Left-sided facial pain and pressure with headaches EXAM: CT HEAD WITHOUT CONTRAST CT MAXILLOFACIAL WITHOUT CONTRAST TECHNIQUE: Multidetector CT imaging of the head and maxillofacial structures were performed using the standard protocol without intravenous contrast. Multiplanar CT image reconstructions of the maxillofacial structures were also generated. COMPARISON:  None. FINDINGS: CT HEAD FINDINGS The bony calvarium is intact. No findings to suggest acute hemorrhage, acute infarction or space-occupying mass lesion are noted. CT MAXILLOFACIAL FINDINGS The bony structures are within normal limits. Paranasal sinuses are  well aerated without focal abnormality. The mastoid air cells are also well aerated. The surrounding soft tissue structures show no focal abnormality. The orbits and their contents are within normal  limits. IMPRESSION: CT of the head:  No acute intracranial abnormality noted. CT the maxillofacial bones:  No acute abnormality noted. Electronically Signed   By: Inez Catalina M.D.   On: 01/15/2016 16:28       01/15/2016, 6:30 PM   Assessment/Plan: 60 year old male with history of hypertension and tobacco dependence woke up with symptoms of numbness involving the left face and pressures sensation. MRI brain is negative for acute stroke. Exact etiology of patient's symptoms is unknown diagnostic consideration would be atypical facial pain. Patient should be taking a daily aspirin for long-term stroke prevention. Findings were discussed in detail with the patient and ER physician. If symptoms persist he should be followed up in neurology clinic in 2-3 weeks

## 2016-01-15 NOTE — ED Notes (Signed)
Pt still in MRI 

## 2016-02-01 ENCOUNTER — Telehealth: Payer: Self-pay | Admitting: Neurology

## 2016-02-01 ENCOUNTER — Encounter: Payer: Self-pay | Admitting: Neurology

## 2016-02-01 ENCOUNTER — Ambulatory Visit (INDEPENDENT_AMBULATORY_CARE_PROVIDER_SITE_OTHER): Payer: 59 | Admitting: Neurology

## 2016-02-01 VITALS — BP 148/78 | HR 58 | Ht 67.0 in | Wt 195.0 lb

## 2016-02-01 DIAGNOSIS — H8111 Benign paroxysmal vertigo, right ear: Secondary | ICD-10-CM | POA: Diagnosis not present

## 2016-02-01 DIAGNOSIS — H8110 Benign paroxysmal vertigo, unspecified ear: Secondary | ICD-10-CM

## 2016-02-01 DIAGNOSIS — R202 Paresthesia of skin: Secondary | ICD-10-CM

## 2016-02-01 HISTORY — DX: Benign paroxysmal vertigo, unspecified ear: H81.10

## 2016-02-01 NOTE — Progress Notes (Signed)
Reason for visit: Facial numbness  Referring physician: [place]  Richard Mccann is a 60 y.o. male  History of present illness:  Richard Mccann is a 60 year old right-handed white male with a history of cervical spondylosis with some neck discomfort and intermittent paresthesias down the arms that has been present for many years. Over the last 4-6 weeks, he has had intermittent episodes of numbness in the left mid face that will come on for several hours and then go away. On 01/15/2016, he woke up with a left facial numbness and it lasted throughout the day. He went to the emergency room for an evaluation, MRI of the brain showed a left periventricular white matter lesion, otherwise the study was unremarkable. The study showed no acute changes. The patient has had an occasional headache in the left frontotemporal area, but this is usually not associated with the numbness. The patient denies any weakness, speech changes, swallowing problems. The patient has a long-standing history of positional vertigo, the vertigo may come on when he turns his head to the right. The patient denies any balance changes, or any bowel or bladder control issues. He has no numbness or weakness of the extremities. There have been no episodes of syncope. He is sent to this office for further evaluation. He currently is to take low-dose aspirin.  Past Medical History:  Diagnosis Date  . Benign paroxysmal positional vertigo 02/01/2016  . Gout   . Hiatal hernia   . Hypertension   . Vertigo     Past Surgical History:  Procedure Laterality Date  . TONSILLECTOMY      Family History  Problem Relation Age of Onset  . Cancer Mother   . Heart attack Father   . Breast cancer Sister     Social history:  reports that he has been smoking Cigarettes.  He has a 22.50 pack-year smoking history. He has never used smokeless tobacco. He reports that he does not drink alcohol or use drugs.  Medications:  Prior to Admission  medications   Medication Sig Start Date End Date Taking? Authorizing Provider  cholecalciferol (VITAMIN D) 1000 units tablet Take 5,000 Units by mouth daily.   Yes Historical Provider, MD  diazepam (VALIUM) 5 MG tablet Take 5 mg by mouth 3 (three) times daily as needed for anxiety.   Yes Historical Provider, MD  Febuxostat (ULORIC) 80 MG TABS Take 40 mg by mouth daily.   Yes Historical Provider, MD  lisinopril (PRINIVIL,ZESTRIL) 20 MG tablet Take 20 mg by mouth every evening. 01/09/16  Yes Historical Provider, MD  omeprazole (PRILOSEC) 40 MG capsule Take 40 mg by mouth daily.   Yes Historical Provider, MD  aspirin 81 MG chewable tablet Chew 1 tablet (81 mg total) by mouth daily. Patient not taking: Reported on 02/01/2016 01/15/16   Allie Bossier, MD      Allergies  Allergen Reactions  . Diclofenac Diarrhea  . Shellfish Allergy Other (See Comments)    Patient states that he "cannot eat it because of"his "arthritis" and gout  . Allopurinol Rash  . Amoxicillin Hives and Rash    ROS:  Out of a complete 14 system review of symptoms, the patient complains only of the following symptoms, and all other reviewed systems are negative.  Fatigue Ringing in the ears, vertigo Blurred vision Snoring Allergies Confusion, headache, numbness Depression, anxiety, racing thoughts  Blood pressure (!) 148/78, pulse (!) 58, height 5\' 7"  (1.702 m), weight 195 lb (88.5 kg).  Physical Exam  General: The patient is alert and cooperative at the time of the examination.  Eyes: Pupils are equal, round, and reactive to light. Discs are flat bilaterally.  Neck: The neck is supple, no carotid bruits are noted.  Respiratory: The respiratory examination is clear.  Cardiovascular: The cardiovascular examination reveals a regular rate and rhythm, no obvious murmurs or rubs are noted.  Skin: Extremities are without significant edema.  Neurologic Exam  Mental status: The patient is alert and oriented x 3 at  the time of the examination. The patient has apparent normal recent and remote memory, with an apparently normal attention span and concentration ability.  Cranial nerves: Facial symmetry is present. There is good sensation of the face to pinprick and soft touch bilaterally. The strength of the facial muscles and the muscles to head turning and shoulder shrug are normal bilaterally. Speech is well enunciated, no aphasia or dysarthria is noted. Extraocular movements are full. Visual fields are full. The tongue is midline, and the patient has symmetric elevation of the soft palate. No obvious hearing deficits are noted.  Motor: The motor testing reveals 5 over 5 strength of all 4 extremities. Good symmetric motor tone is noted throughout.  Sensory: Sensory testing is intact to pinprick, soft touch, vibration sensation, and position sense on all 4 extremities. No evidence of extinction is noted.  Coordination: Cerebellar testing reveals good finger-nose-finger and heel-to-shin bilaterally.  Gait and station: Gait is normal. Tandem gait is normal. Romberg is negative. No drift is seen.  Reflexes: Deep tendon reflexes are symmetric and normal bilaterally. Toes are downgoing bilaterally.   Assessment/Plan:  1. Subjective left facial numbness  2. Abnormal MRI brain  The patient does have a single white matter lesion on MRI in the left periventricular white matter. The patient does have a history of hypertension. The patient will be sent for blood work today, he will be set up for a carotid Doppler study. The etiology of the left facial numbness likely will not be determined, but this is likely to be a benign issue. The patient will follow-up through this office if needed. He is to go on low-dose aspirin.  Jill Alexanders MD 02/01/2016 9:19 AM  [facility] [address] [place], [place] [address]  Phone [phone] Fax [phone]

## 2016-02-01 NOTE — Telephone Encounter (Signed)
[facility] Patient is ready to be scheduled for doppler No PA needed. Thanks Hinton Dyer.

## 2016-02-04 LAB — COPPER, SERUM: COPPER: 74 ug/dL (ref 72–166)

## 2016-02-04 LAB — ANGIOTENSIN CONVERTING ENZYME

## 2016-02-04 LAB — VITAMIN B12: Vitamin B-12: 544 pg/mL (ref 211–946)

## 2016-02-04 LAB — B. BURGDORFI ANTIBODIES: Lyme IgG/IgM Ab: <0.91 {ISR} (ref 0.00–0.90)

## 2016-02-04 LAB — SEDIMENTATION RATE: Sed Rate: 2 mm/hr (ref 0–30)

## 2016-02-04 LAB — ANA W/REFLEX: ANA: NEGATIVE

## 2016-02-13 ENCOUNTER — Ambulatory Visit (INDEPENDENT_AMBULATORY_CARE_PROVIDER_SITE_OTHER): Payer: 59

## 2016-02-13 DIAGNOSIS — R202 Paresthesia of skin: Secondary | ICD-10-CM | POA: Diagnosis not present

## 2016-02-21 ENCOUNTER — Telehealth: Payer: Self-pay | Admitting: Neurology

## 2016-02-21 NOTE — Telephone Encounter (Signed)
I called patient. A carotid Doppler study was unremarkable. I discussed this with him.

## 2016-02-21 NOTE — Telephone Encounter (Signed)
Pt's wife called for doppler results. Please call and advise

## 2016-03-04 ENCOUNTER — Telehealth: Payer: Self-pay | Admitting: Neurology

## 2016-03-04 DIAGNOSIS — R202 Paresthesia of skin: Secondary | ICD-10-CM

## 2016-03-04 NOTE — Telephone Encounter (Signed)
Pt's wife called sts she checked on mychart looking for doppler results. She is wanting to know if the results will be down loaded to mychart or if she can have the results sent to the home address.  She also sts the pt is having numbness in the bil arms to the hands on one occasion and intermittently on the rt extremity down to the hand. Please call today

## 2016-03-04 NOTE — Telephone Encounter (Signed)
I called the patient, talk with the wife. The carotid Doppler study reports are done on the outside computer system, they need to be scanned into Epic, may not be available for several weeks.  The patient has had chronic issues with neck pain, paresthesias down both arms, last MRI of the neck was in 2013. If the patient is having increasing problems with neck discomfort, we can repeat MRI of the cervical spine. The wife or the patient may call us back if this is the case.

## 2016-03-28 NOTE — Telephone Encounter (Signed)
I spoke with patient today and he stated that he would like to proceed with MRI. He is having issues with paresthesias and would like to come in sooner rather than later.

## 2016-03-31 NOTE — Addendum Note (Signed)
Addended by: Margette Fast on: 03/31/2016 05:24 PM   Modules accepted: Orders

## 2016-03-31 NOTE — Telephone Encounter (Signed)
I called patient. We will schedule the MRI the cervical spine, I will call him when I get the results.

## 2016-04-01 NOTE — Telephone Encounter (Signed)
Pt's wife called to schedule MRI. Please call

## 2016-04-02 ENCOUNTER — Ambulatory Visit (INDEPENDENT_AMBULATORY_CARE_PROVIDER_SITE_OTHER): Payer: 59

## 2016-04-02 DIAGNOSIS — R202 Paresthesia of skin: Secondary | ICD-10-CM | POA: Diagnosis not present

## 2016-04-02 NOTE — Telephone Encounter (Signed)
This case is requiring a peer to peer, the patient is trying to come in this afternoon at 4. The phone number and case number are listed below, if patient needs to be rescheduled please let me know.   [phone] opt 3 case number [id number]

## 2016-04-02 NOTE — Telephone Encounter (Signed)
I called him not offer sensation, authorization is good until 05/17/2016. The authorization number is [id number]

## 2016-04-04 ENCOUNTER — Telehealth: Payer: Self-pay | Admitting: Neurology

## 2016-04-04 NOTE — Telephone Encounter (Signed)
I called patient. The patient has evidence of possible nerve root impingement at the C7 level bilaterally. The patient reports some discomfort in the shoulders, he may have tingling from the shoulder level down into the hands on the left or right side. The patient is not on any medications for the discomfort. I have suggested an epidural steroid injection the neck, he will call our office if he decides that he wishes to have this done.   MRI cervical 04/03/16:  IMPRESSION:  The cervical spine shows multilevel degenerative changes as detailed above. Most significant findings are 1.   At C5-C6,  there is a right paramedian disc protrusion and uncovertebral spurring causing borderline spinal stenosis and mild right foraminal narrowing but no nerve root compression. When compared to the prior study, there has been some progression at this level. 2.   At C6-C7, there is mild spinal stenosis due to retrolisthesis, disc protrusion and left greater than right uncovertebral spurring is moderately severe bilateral foraminal narrowing that could lead to compression of either of the C7 nerve roots. 3.   Degenerative changes at C2-C3, C3-C4 and C4-C5 do not lead to nerve root compression and are stable compared to the 2013 MRI.

## 2017-01-19 ENCOUNTER — Other Ambulatory Visit: Payer: Self-pay | Admitting: Family Medicine

## 2017-01-19 ENCOUNTER — Ambulatory Visit
Admission: RE | Admit: 2017-01-19 | Discharge: 2017-01-19 | Disposition: A | Discharge status: Home / Self Care | Payer: 59 | Source: Ambulatory Visit | Attending: Family Medicine | Admitting: Family Medicine

## 2017-01-19 DIAGNOSIS — R1032 Left lower quadrant pain: Secondary | ICD-10-CM

## 2017-01-19 DIAGNOSIS — R1012 Left upper quadrant pain: Secondary | ICD-10-CM

## 2017-05-18 ENCOUNTER — Other Ambulatory Visit: Payer: Self-pay | Admitting: Family Medicine

## 2017-05-18 DIAGNOSIS — M5442 Lumbago with sciatica, left side: Secondary | ICD-10-CM

## 2017-06-01 ENCOUNTER — Ambulatory Visit
Admission: RE | Admit: 2017-06-01 | Discharge: 2017-06-01 | Disposition: A | Discharge status: Home / Self Care | Payer: 59 | Source: Ambulatory Visit | Attending: Family Medicine | Admitting: Family Medicine

## 2017-06-01 DIAGNOSIS — M5442 Lumbago with sciatica, left side: Secondary | ICD-10-CM

## 2017-09-09 ENCOUNTER — Other Ambulatory Visit: Payer: Self-pay | Admitting: General Surgery

## 2017-09-09 DIAGNOSIS — R1032 Left lower quadrant pain: Secondary | ICD-10-CM

## 2017-09-11 ENCOUNTER — Ambulatory Visit
Admission: RE | Admit: 2017-09-11 | Discharge: 2017-09-11 | Disposition: A | Discharge status: Home / Self Care | Payer: 59 | Source: Ambulatory Visit | Attending: General Surgery | Admitting: General Surgery

## 2017-09-11 DIAGNOSIS — R1032 Left lower quadrant pain: Secondary | ICD-10-CM

## 2017-09-11 MED ORDER — IOPAMIDOL (ISOVUE-300) INJECTION 61%
100.0000 mL | Freq: Once | INTRAVENOUS | Status: AC | PRN
  Administered 2017-09-11: 100 mL via INTRAVENOUS

## 2018-08-03 ENCOUNTER — Emergency Department (HOSPITAL_COMMUNITY): Payer: 59

## 2018-08-03 ENCOUNTER — Emergency Department (HOSPITAL_COMMUNITY)
Admission: EM | Admit: 2018-08-03 | Discharge: 2018-08-03 | Disposition: A | Discharge status: Home / Self Care | Payer: 59 | Attending: Emergency Medicine | Admitting: Emergency Medicine

## 2018-08-03 ENCOUNTER — Encounter (HOSPITAL_COMMUNITY): Payer: Self-pay

## 2018-08-03 DIAGNOSIS — R1032 Left lower quadrant pain: Secondary | ICD-10-CM

## 2018-08-03 DIAGNOSIS — F17210 Nicotine dependence, cigarettes, uncomplicated: Secondary | ICD-10-CM | POA: Insufficient documentation

## 2018-08-03 DIAGNOSIS — I10 Essential (primary) hypertension: Secondary | ICD-10-CM | POA: Diagnosis not present

## 2018-08-03 DIAGNOSIS — N400 Enlarged prostate without lower urinary tract symptoms: Secondary | ICD-10-CM | POA: Diagnosis not present

## 2018-08-03 DIAGNOSIS — K5790 Diverticulosis of intestine, part unspecified, without perforation or abscess without bleeding: Secondary | ICD-10-CM | POA: Insufficient documentation

## 2018-08-03 LAB — COMPREHENSIVE METABOLIC PANEL
ALK PHOS: 60 U/L (ref 38–126)
ALT: 25 U/L (ref 0–44)
ANION GAP: 13 (ref 5–15)
AST: 29 U/L (ref 15–41)
Albumin: 4.1 g/dL (ref 3.5–5.0)
BUN: 15 mg/dL (ref 8–23)
CALCIUM: 9.2 mg/dL (ref 8.9–10.3)
CO2: 21 mmol/L — ABNORMAL LOW (ref 22–32)
Chloride: 105 mmol/L (ref 98–111)
Creatinine, Ser: 1.44 mg/dL — ABNORMAL HIGH (ref 0.61–1.24)
GFR calc Af Amer: 59 mL/min — ABNORMAL LOW (ref >60)
GFR calc non Af Amer: 51 mL/min — ABNORMAL LOW (ref >60)
Glucose, Bld: 140 mg/dL — ABNORMAL HIGH (ref 70–99)
Potassium: 3.7 mmol/L (ref 3.5–5.1)
Sodium: 139 mmol/L (ref 135–145)
TOTAL PROTEIN: 6.9 g/dL (ref 6.5–8.1)
Total Bilirubin: 1 mg/dL (ref 0.3–1.2)

## 2018-08-03 LAB — URINALYSIS, ROUTINE W REFLEX MICROSCOPIC
BILIRUBIN URINE: NEGATIVE
Glucose, UA: NEGATIVE mg/dL
KETONES UR: NEGATIVE mg/dL
Leukocytes, UA: NEGATIVE
NITRITE: NEGATIVE
Protein, ur: NEGATIVE mg/dL
Specific Gravity, Urine: 1.023 (ref 1.005–1.030)
pH: 5 (ref 5.0–8.0)

## 2018-08-03 LAB — CBC WITH DIFFERENTIAL/PLATELET
Abs Immature Granulocytes: 0.02 10*3/uL (ref 0.00–0.07)
Basophils Absolute: 0.1 10*3/uL (ref 0.0–0.1)
Basophils Relative: 1 %
EOS ABS: 0 10*3/uL (ref 0.0–0.5)
Eosinophils Relative: 1 %
HEMATOCRIT: 48.7 % (ref 39.0–52.0)
Hemoglobin: 16.2 g/dL (ref 13.0–17.0)
Immature Granulocytes: 0 %
Lymphocytes Relative: 25 %
Lymphs Abs: 2 10*3/uL (ref 0.7–4.0)
MCH: 30.3 pg (ref 26.0–34.0)
MCHC: 33.3 g/dL (ref 30.0–36.0)
MCV: 91 fL (ref 80.0–100.0)
Monocytes Absolute: 0.3 10*3/uL (ref 0.1–1.0)
Monocytes Relative: 4 %
NEUTROS PCT: 69 %
Neutro Abs: 5.7 10*3/uL (ref 1.7–7.7)
Platelets: 197 10*3/uL (ref 150–400)
RBC: 5.35 MIL/uL (ref 4.22–5.81)
RDW: 12.9 % (ref 11.5–15.5)
WBC: 8.2 10*3/uL (ref 4.0–10.5)
nRBC: 0 % (ref 0.0–0.2)

## 2018-08-03 LAB — LIPASE, BLOOD: Lipase: 32 U/L (ref 11–51)

## 2018-08-03 LAB — CK: Total CK: 66 U/L (ref 49–397)

## 2018-08-03 MED ORDER — IOPAMIDOL (ISOVUE-370) INJECTION 76%
100.0000 mL | Freq: Once | INTRAVENOUS | Status: AC | PRN
  Administered 2018-08-03: 100 mL via INTRAVENOUS

## 2018-08-03 NOTE — ED Triage Notes (Signed)
Pt here from home with c/o llq off and on for 2 years , pt had a ct scan in march that was fine

## 2018-08-03 NOTE — ED Provider Notes (Signed)
Emergency Department Provider Note   I have reviewed the triage vital signs and the nursing notes.   HISTORY  Chief Complaint Abdominal Pain (LLQ pain)   HPI Richard Mccann is a 63 y.o. male with medical problems documented below the presents the emergency department today with 2 to 3 years of progressively worsening left lower quadrant domino pain.  Patient states that has been progressively worsening but been significantly worse over the last couple weeks.  Patient denies any association with eating or other GI symptoms.  He states it does seem to get worse with exertion but then better with rest but never really goes away.  Sometimes it is worse with bending over.  Sometimes worse with palpation.  He has had multiple things done to include urology consult, GI consult, neurosurgery consult, primary care work-up all of which were negative.  Patient without any recent related stress.  Negative CT abdomen and pelvis done a year ago which was reportedly negative. No other associated or modifying symptoms.    Past Medical History:  Diagnosis Date  . Benign paroxysmal positional vertigo 02/01/2016  . Gout   . Hiatal hernia   . Hypertension   . Vertigo     Patient Active Problem List   Diagnosis Date Noted  . Benign paroxysmal positional vertigo 02/01/2016    Past Surgical History:  Procedure Laterality Date  . TONSILLECTOMY      Current Outpatient Rx  . Order #: [id number] Class: Print  . Order #: [id number] Class: Historical Med  . Order #: [id number] Class: Historical Med  . Order #: [id number] Class: Historical Med  . Order #: [id number] Class: Historical Med  . Order #: [id number] Class: Historical Med    Allergies Diclofenac; Shellfish allergy; Allopurinol; and Amoxicillin  Family History  Problem Relation Age of Onset  . Cancer Mother   . Heart attack Father   . Breast cancer Sister     Social History Social History   Tobacco Use  . Smoking status: Current Every Day  Smoker    Packs/day: 0.75    Years: 30.00    Pack years: 22.50    Types: Cigarettes  . Smokeless tobacco: Never Used  Substance Use Topics  . Alcohol use: No  . Drug use: No    Review of Systems  All other systems negative except as documented in the HPI. All pertinent positives and negatives as reviewed in the HPI. ____________________________________________   PHYSICAL EXAM:  VITAL SIGNS: ED Triage Vitals [08/03/18 1025]  Enc Vitals Group     BP (!) 161/93     Pulse Rate 78     Resp 16     Temp 98.3 F (36.8 C)     Temp Source Oral     SpO2 98 %     Weight      Height      Head Circumference      Peak Flow      Pain Score      Pain Loc      Pain Edu?      Excl. in [place]?     Constitutional: Alert and oriented. Well appearing and in no acute distress. Eyes: Conjunctivae are normal. PERRL. EOMI. Head: Atraumatic. Nose: No congestion/rhinnorhea. Mouth/Throat: Mucous membranes are moist.  Oropharynx non-erythematous. Neck: No stridor.  No meningeal signs.   Cardiovascular: Normal rate, regular rhythm. Good peripheral circulation. Grossly normal heart sounds.   Respiratory: Normal respiratory effort.  No retractions. Lungs CTAB. Gastrointestinal: Soft and nontender. No distention.  Musculoskeletal: No lower extremity tenderness nor edema. No gross deformities of extremities. Neurologic:  Normal speech and language. No gross focal neurologic deficits are appreciated.  Skin:  Skin is warm, dry and intact. No rash noted.   ____________________________________________   LABS (all labs ordered are listed, but only abnormal results are displayed)  Labs Reviewed  COMPREHENSIVE METABOLIC PANEL  LIPASE, BLOOD  CBC WITH DIFFERENTIAL/PLATELET  URINALYSIS, ROUTINE W REFLEX MICROSCOPIC   ____________________________________________  EKG   EKG Interpretation  Date/Time:    Ventricular Rate:    PR Interval:    QRS Duration:   QT Interval:    QTC Calculation:   R  Axis:     Text Interpretation:         ____________________________________________  RADIOLOGY  No results found.  ____________________________________________   PROCEDURES  Procedure(s) performed:   Procedures   ____________________________________________   INITIAL IMPRESSION / ASSESSMENT AND PLAN / ED COURSE  Unclear etiology for patient's symptoms.  However with his exertional component consider possible arterial insufficiency so we will get a CT angios abdomen pelvis and an EKG.  Also consider testicular issues.  No obvious causes for symptoms at this time. Will consider MRI as an outpatient and further workup with PCP.  Pertinent labs & imaging results that were available during my care of the patient were reviewed by me and considered in my medical decision making (see chart for details).  ____________________________________________  FINAL CLINICAL IMPRESSION(S) / ED DIAGNOSES  Final diagnoses:  None     MEDICATIONS GIVEN DURING THIS VISIT:  Medications - No data to display   NEW OUTPATIENT MEDICATIONS STARTED DURING THIS VISIT:  New Prescriptions   No medications on file    Note:  This note was prepared with assistance of Dragon voice recognition software. Occasional wrong-word or sound-a-like substitutions may have occurred due to the inherent limitations of voice recognition software.   Merrily Pew, MD 08/04/18 [id number]

## 2018-08-10 ENCOUNTER — Other Ambulatory Visit: Payer: Self-pay | Admitting: Family Medicine

## 2018-08-10 DIAGNOSIS — R1032 Left lower quadrant pain: Secondary | ICD-10-CM

## 2018-08-17 ENCOUNTER — Ambulatory Visit
Admission: RE | Admit: 2018-08-17 | Discharge: 2018-08-17 | Disposition: A | Discharge status: Home / Self Care | Payer: 59 | Source: Ambulatory Visit | Attending: Family Medicine | Admitting: Family Medicine

## 2018-08-17 DIAGNOSIS — R1032 Left lower quadrant pain: Secondary | ICD-10-CM

## 2019-09-04 IMAGING — CT CT CTA ABD/PEL W/CM AND/OR W/O CM
3 of 12 series · 12 of 46 positions shown, 17 images · IV contrast (APPLIED)
Comparison: 09/11/2017

CLINICAL DATA: Pt here from home with c/o llq off and on for 2
years , pt had a ct scan in [REDACTED] that was fine pt states pain keeps
getting worse.

EXAM:
CTA ABDOMEN AND PELVIS WITHOUT AND WITH CONTRAST
TECHNIQUE: Multidetector CT imaging of the abdomen and pelvis was performed
using the standard protocol during bolus administration of
intravenous contrast. Multiplanar reconstructed images and MIPs were
obtained and reviewed to evaluate the vascular anatomy.
CONTRAST:  100mL [id number] IOPAMIDOL ([id number]) INJECTION 76%

[Series 8: cor · coronal · 0.73mm/px · 2 of 150 slices shown, 3 images]
[im 50/150  soft-tissue]
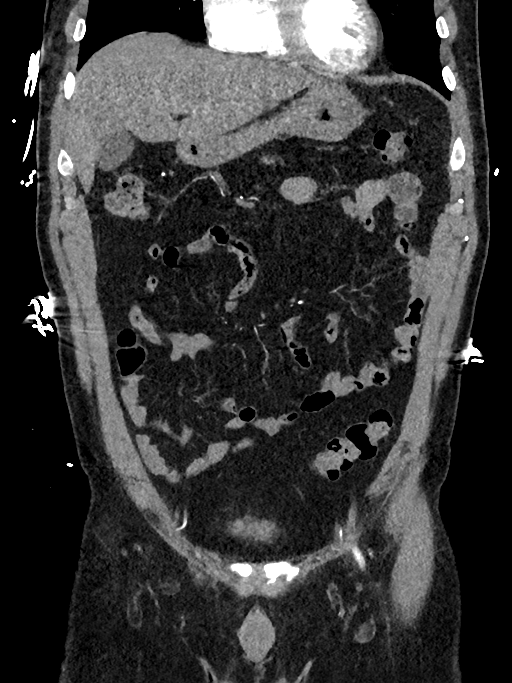
[im 50/150  bone]
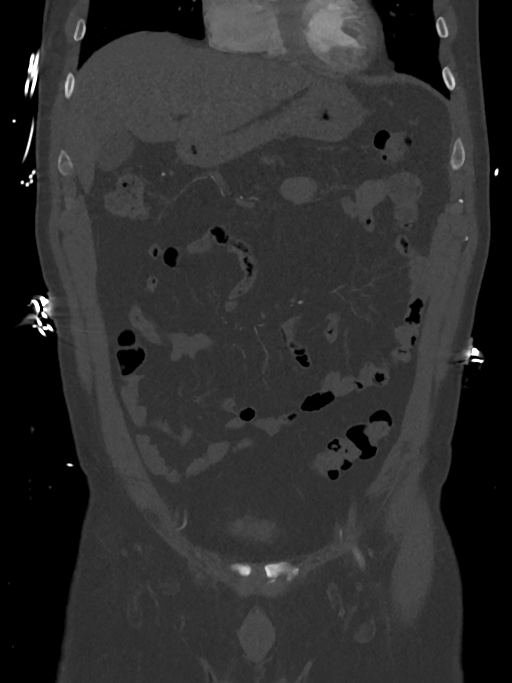
[im 100/150  soft-tissue]
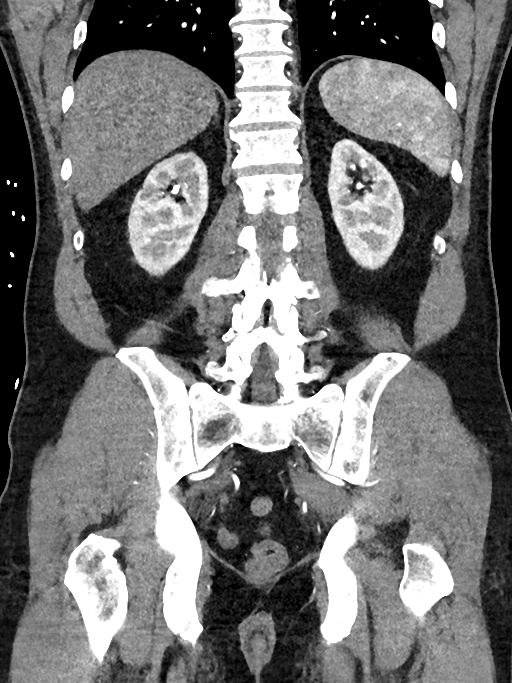

[Series 12: venous · axial · portal-venous · 0.70mm/px · z∈[-483,+11]mm · 3 of 248 slices shown, 7 images]
[im 1/248  soft-tissue]
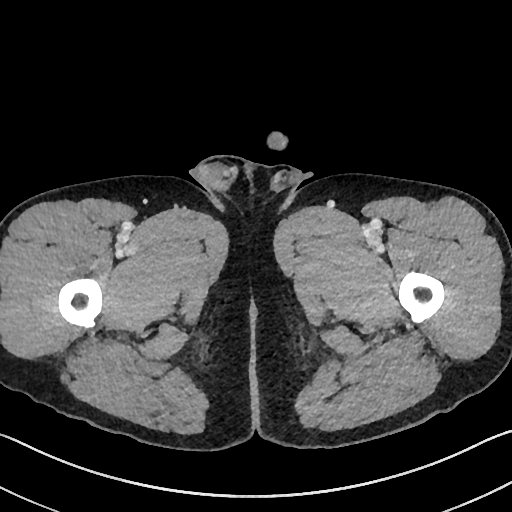
[im 1/248  lung]
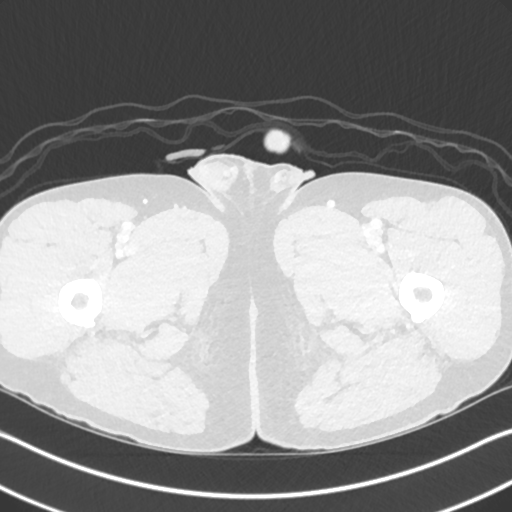
[im 1/248  bone]
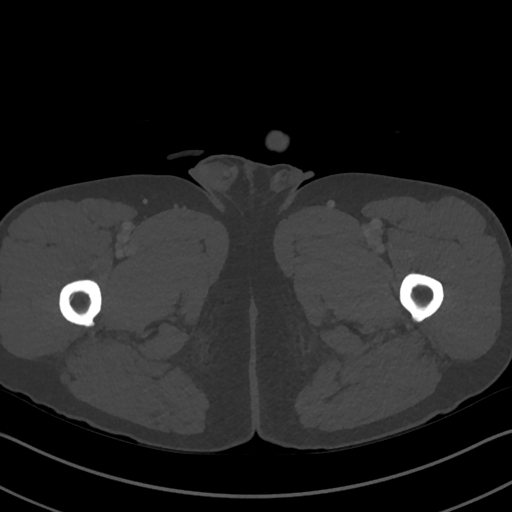
[im 124/248  soft-tissue]
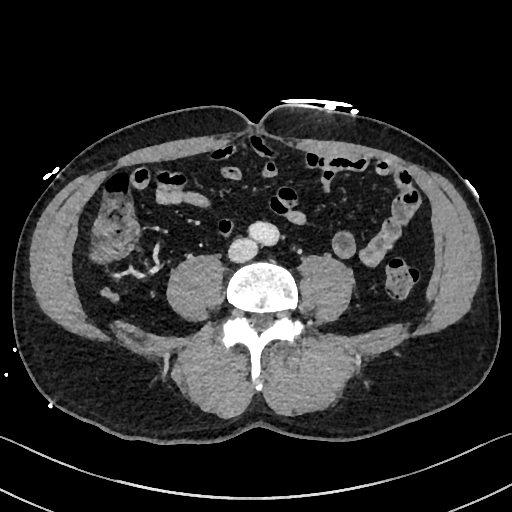
[im 124/248  lung]
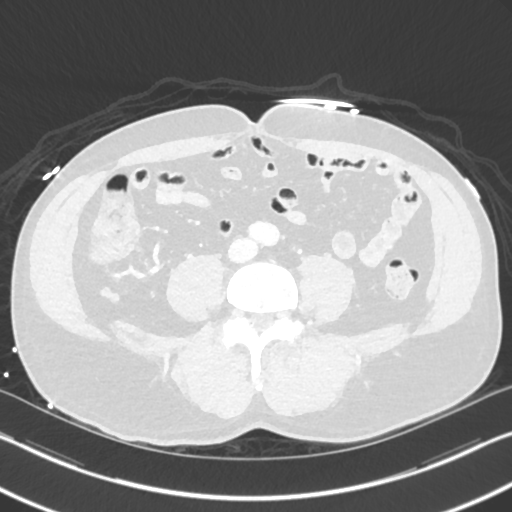
[im 248/248  soft-tissue]
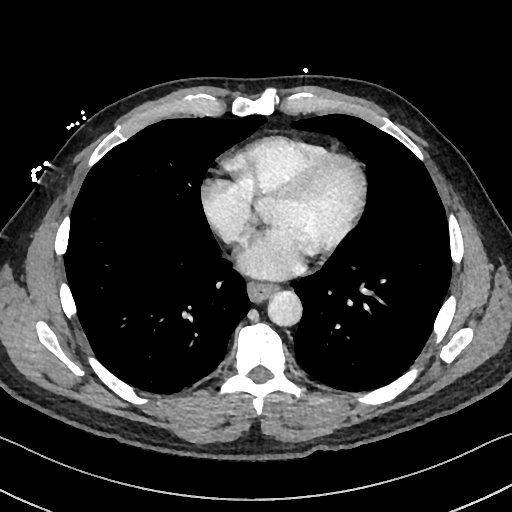
[im 248/248  lung]
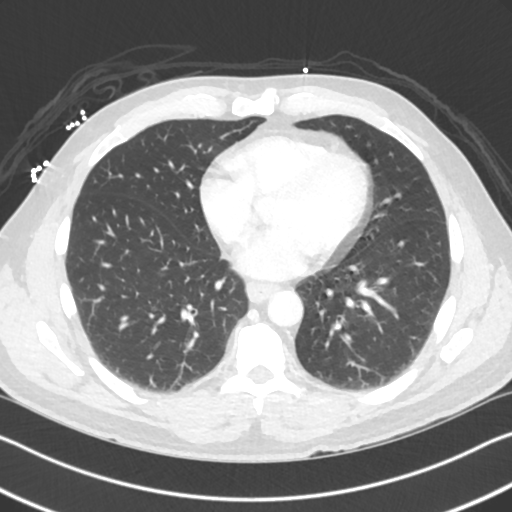

[Series 13: venous thins · axial · portal-venous · 0.70mm/px · z∈[-443,-113]mm · 7 of 1239 slices shown]
[im 104/1239  soft-tissue]
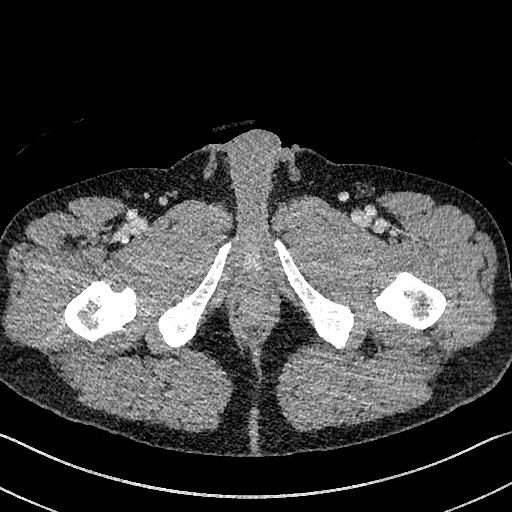
[im 310/1239  soft-tissue]
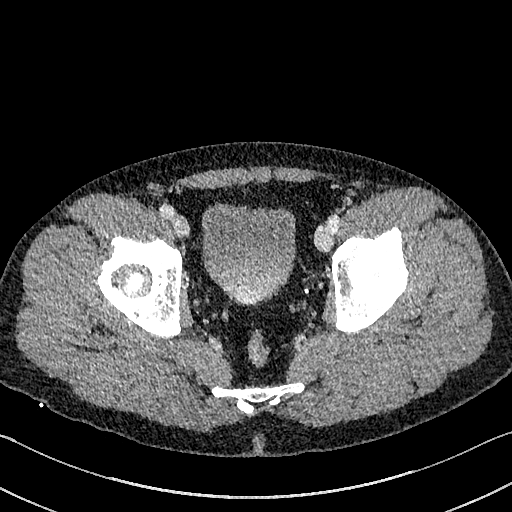
[im 413/1239  soft-tissue]
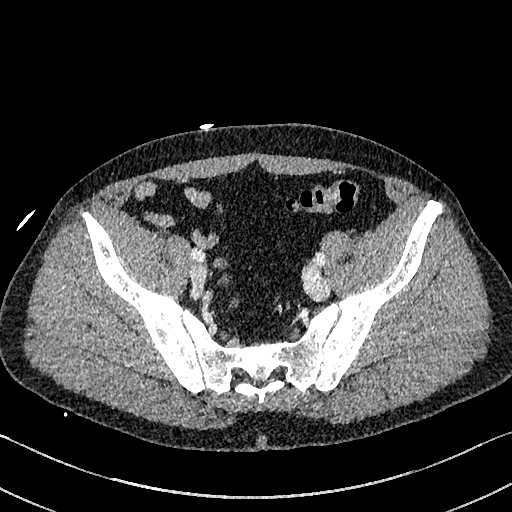
[im 516/1239  soft-tissue]
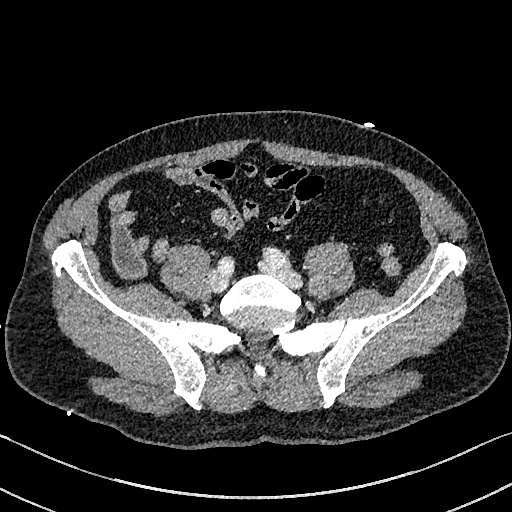
[im 723/1239  soft-tissue]
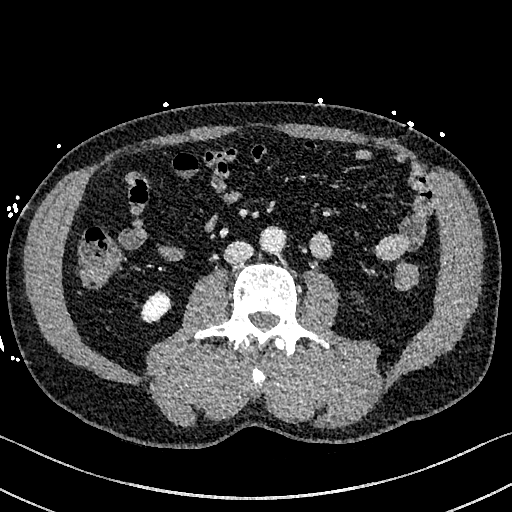
[im 826/1239  soft-tissue]
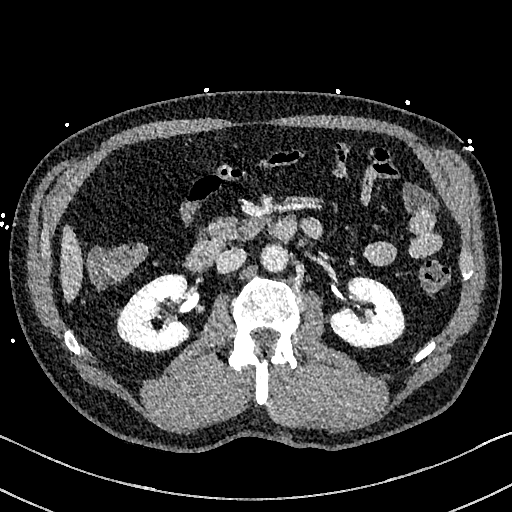
[im 929/1239  soft-tissue]
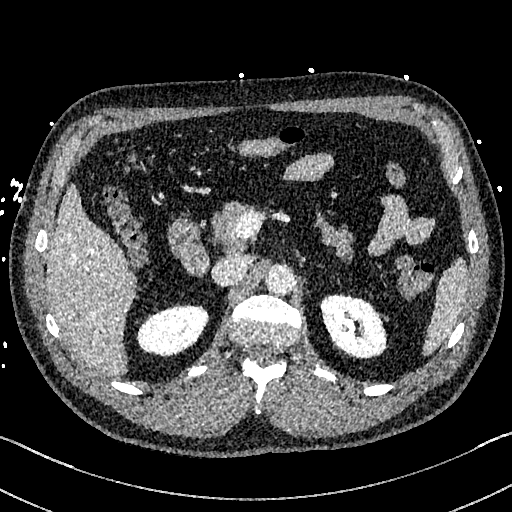

[12 of 46 positions shown; findings below may reference images not displayed]

FINDINGS: VASCULAR

Aorta: Normal caliber aorta without aneurysm, dissection, vasculitis
or significant stenosis. Minimal atherosclerosis.

Celiac: Patent without evidence of aneurysm, dissection, vasculitis
or significant stenosis.

SMA: Patent without evidence of aneurysm, dissection, vasculitis or
significant stenosis.

Renals: Both renal arteries are patent without evidence of aneurysm,
dissection, vasculitis, fibromuscular dysplasia or significant
stenosis.

IMA: Patent without evidence of aneurysm, dissection, vasculitis or
significant stenosis.

Inflow: Patent without evidence of aneurysm, dissection, vasculitis
or significant stenosis.

Proximal Outflow: Bilateral common femoral and visualized portions
of the superficial and profunda femoral arteries are patent without
evidence of aneurysm, dissection, vasculitis or significant
stenosis.

Veins: No obvious venous abnormality within the limitations of this
arterial phase study.

Review of the MIP images confirms the above findings.

NON-VASCULAR

Lower chest: Clear lung bases.  Heart normal in size.

Hepatobiliary: No focal liver abnormality is seen. No gallstones,
gallbladder wall thickening, or biliary dilatation.

Pancreas: Unremarkable. No pancreatic ductal dilatation or
surrounding inflammatory changes.

Spleen: Normal in size without focal abnormality.

Adrenals/Urinary Tract: No adrenal masses. Kidneys normal in overall
size, orientation and position. No renal masses, stones or
hydronephrosis. Normal ureters. Normal bladder.

Stomach/Bowel: Stomach is unremarkable. Small bowel is normal in
caliber. No wall thickening or inflammation. Colon is normal in
caliber. No colonic wall thickening or inflammation. There are few
left colon diverticula without inflammation. Normal appendix is
visualized.

Lymphatic: No lymphadenopathy.

Reproductive: Prostate mildly enlarged measuring 4.7 x 3.9 cm
transversely.

Other: No abdominal wall hernia or abnormality. No abdominopelvic
ascites.

Musculoskeletal: No fracture or acute finding. No osteoblastic or
osteolytic lesions.
IMPRESSION: VASCULAR

1. Normal caliber abdominal aorta. No dissection. Minimal
atherosclerotic plaque.
2. Aortic branch vessels widely patent. Iliac and visualized femoral
vessels are widely patent.

NON-VASCULAR

1. No acute findings. No findings to account for the patient's
intermittent left lower quadrant pain.
2. There are several left colon diverticula, but no diverticulitis.
No bowel inflammation.

## 2020-03-27 DIAGNOSIS — R69 Illness, unspecified: Secondary | ICD-10-CM | POA: Diagnosis not present

## 2020-03-27 DIAGNOSIS — N183 Chronic kidney disease, stage 3 unspecified: Secondary | ICD-10-CM | POA: Diagnosis not present

## 2020-03-27 DIAGNOSIS — G8929 Other chronic pain: Secondary | ICD-10-CM | POA: Diagnosis not present

## 2020-03-27 DIAGNOSIS — M109 Gout, unspecified: Secondary | ICD-10-CM | POA: Diagnosis not present

## 2020-03-27 DIAGNOSIS — R7309 Other abnormal glucose: Secondary | ICD-10-CM | POA: Diagnosis not present

## 2020-03-27 DIAGNOSIS — R7303 Prediabetes: Secondary | ICD-10-CM | POA: Diagnosis not present

## 2020-03-27 DIAGNOSIS — R1032 Left lower quadrant pain: Secondary | ICD-10-CM | POA: Diagnosis not present

## 2020-03-27 DIAGNOSIS — K219 Gastro-esophageal reflux disease without esophagitis: Secondary | ICD-10-CM | POA: Diagnosis not present

## 2020-03-27 DIAGNOSIS — I10 Essential (primary) hypertension: Secondary | ICD-10-CM | POA: Diagnosis not present

## 2020-03-27 DIAGNOSIS — E559 Vitamin D deficiency, unspecified: Secondary | ICD-10-CM | POA: Diagnosis not present

## 2020-03-27 DIAGNOSIS — R42 Dizziness and giddiness: Secondary | ICD-10-CM | POA: Diagnosis not present

## 2020-05-21 DIAGNOSIS — R69 Illness, unspecified: Secondary | ICD-10-CM | POA: Diagnosis not present

## 2020-06-05 DIAGNOSIS — K219 Gastro-esophageal reflux disease without esophagitis: Secondary | ICD-10-CM | POA: Diagnosis not present

## 2020-06-05 DIAGNOSIS — M109 Gout, unspecified: Secondary | ICD-10-CM | POA: Diagnosis not present

## 2020-06-05 DIAGNOSIS — I10 Essential (primary) hypertension: Secondary | ICD-10-CM | POA: Diagnosis not present

## 2020-06-05 DIAGNOSIS — Z7982 Long term (current) use of aspirin: Secondary | ICD-10-CM | POA: Diagnosis not present

## 2020-06-05 DIAGNOSIS — Z8249 Family history of ischemic heart disease and other diseases of the circulatory system: Secondary | ICD-10-CM | POA: Diagnosis not present

## 2020-06-05 DIAGNOSIS — Z809 Family history of malignant neoplasm, unspecified: Secondary | ICD-10-CM | POA: Diagnosis not present

## 2020-06-05 DIAGNOSIS — R42 Dizziness and giddiness: Secondary | ICD-10-CM | POA: Diagnosis not present

## 2020-06-05 DIAGNOSIS — M1990 Unspecified osteoarthritis, unspecified site: Secondary | ICD-10-CM | POA: Diagnosis not present

## 2020-06-05 DIAGNOSIS — Z720 Tobacco use: Secondary | ICD-10-CM | POA: Diagnosis not present

## 2020-06-05 DIAGNOSIS — R69 Illness, unspecified: Secondary | ICD-10-CM | POA: Diagnosis not present

## 2020-08-29 DIAGNOSIS — L720 Epidermal cyst: Secondary | ICD-10-CM | POA: Diagnosis not present

## 2020-08-29 DIAGNOSIS — L821 Other seborrheic keratosis: Secondary | ICD-10-CM | POA: Diagnosis not present

## 2020-08-29 DIAGNOSIS — L820 Inflamed seborrheic keratosis: Secondary | ICD-10-CM | POA: Diagnosis not present

## 2020-11-16 DIAGNOSIS — R42 Dizziness and giddiness: Secondary | ICD-10-CM | POA: Diagnosis not present

## 2020-11-16 DIAGNOSIS — N183 Chronic kidney disease, stage 3 unspecified: Secondary | ICD-10-CM | POA: Diagnosis not present

## 2020-11-16 DIAGNOSIS — K219 Gastro-esophageal reflux disease without esophagitis: Secondary | ICD-10-CM | POA: Diagnosis not present

## 2020-11-16 DIAGNOSIS — R7303 Prediabetes: Secondary | ICD-10-CM | POA: Diagnosis not present

## 2020-11-16 DIAGNOSIS — I10 Essential (primary) hypertension: Secondary | ICD-10-CM | POA: Diagnosis not present

## 2020-11-16 DIAGNOSIS — M109 Gout, unspecified: Secondary | ICD-10-CM | POA: Diagnosis not present

## 2021-03-08 DIAGNOSIS — H2511 Age-related nuclear cataract, right eye: Secondary | ICD-10-CM | POA: Diagnosis not present

## 2021-03-08 DIAGNOSIS — H40013 Open angle with borderline findings, low risk, bilateral: Secondary | ICD-10-CM | POA: Diagnosis not present

## 2021-03-08 DIAGNOSIS — H47392 Other disorders of optic disc, left eye: Secondary | ICD-10-CM | POA: Diagnosis not present

## 2021-03-08 DIAGNOSIS — D492 Neoplasm of unspecified behavior of bone, soft tissue, and skin: Secondary | ICD-10-CM | POA: Diagnosis not present

## 2021-03-19 DIAGNOSIS — U071 COVID-19: Secondary | ICD-10-CM | POA: Diagnosis not present

## 2021-04-11 DIAGNOSIS — H2511 Age-related nuclear cataract, right eye: Secondary | ICD-10-CM | POA: Diagnosis not present

## 2021-05-22 DIAGNOSIS — Z125 Encounter for screening for malignant neoplasm of prostate: Secondary | ICD-10-CM | POA: Diagnosis not present

## 2021-05-22 DIAGNOSIS — R7303 Prediabetes: Secondary | ICD-10-CM | POA: Diagnosis not present

## 2021-05-22 DIAGNOSIS — R42 Dizziness and giddiness: Secondary | ICD-10-CM | POA: Diagnosis not present

## 2021-05-22 DIAGNOSIS — E781 Pure hyperglyceridemia: Secondary | ICD-10-CM | POA: Diagnosis not present

## 2021-05-22 DIAGNOSIS — M109 Gout, unspecified: Secondary | ICD-10-CM | POA: Diagnosis not present

## 2021-05-22 DIAGNOSIS — I10 Essential (primary) hypertension: Secondary | ICD-10-CM | POA: Diagnosis not present

## 2021-05-22 DIAGNOSIS — K219 Gastro-esophageal reflux disease without esophagitis: Secondary | ICD-10-CM | POA: Diagnosis not present

## 2021-05-22 DIAGNOSIS — N183 Chronic kidney disease, stage 3 unspecified: Secondary | ICD-10-CM | POA: Diagnosis not present

## 2021-11-19 DIAGNOSIS — I10 Essential (primary) hypertension: Secondary | ICD-10-CM | POA: Diagnosis not present

## 2021-11-19 DIAGNOSIS — Z0000 Encounter for general adult medical examination without abnormal findings: Secondary | ICD-10-CM | POA: Diagnosis not present

## 2021-11-19 DIAGNOSIS — M109 Gout, unspecified: Secondary | ICD-10-CM | POA: Diagnosis not present

## 2021-11-19 DIAGNOSIS — N183 Chronic kidney disease, stage 3 unspecified: Secondary | ICD-10-CM | POA: Diagnosis not present

## 2021-11-19 DIAGNOSIS — K219 Gastro-esophageal reflux disease without esophagitis: Secondary | ICD-10-CM | POA: Diagnosis not present

## 2021-11-19 DIAGNOSIS — R7303 Prediabetes: Secondary | ICD-10-CM | POA: Diagnosis not present

## 2021-11-19 DIAGNOSIS — E559 Vitamin D deficiency, unspecified: Secondary | ICD-10-CM | POA: Diagnosis not present

## 2021-11-19 DIAGNOSIS — R42 Dizziness and giddiness: Secondary | ICD-10-CM | POA: Diagnosis not present

## 2022-01-01 DIAGNOSIS — D492 Neoplasm of unspecified behavior of bone, soft tissue, and skin: Secondary | ICD-10-CM | POA: Diagnosis not present

## 2022-01-01 DIAGNOSIS — H2512 Age-related nuclear cataract, left eye: Secondary | ICD-10-CM | POA: Diagnosis not present

## 2022-01-01 DIAGNOSIS — Z961 Presence of intraocular lens: Secondary | ICD-10-CM | POA: Diagnosis not present

## 2022-01-01 DIAGNOSIS — H40013 Open angle with borderline findings, low risk, bilateral: Secondary | ICD-10-CM | POA: Diagnosis not present

## 2022-01-01 DIAGNOSIS — H47392 Other disorders of optic disc, left eye: Secondary | ICD-10-CM | POA: Diagnosis not present

## 2022-02-05 DIAGNOSIS — L720 Epidermal cyst: Secondary | ICD-10-CM | POA: Diagnosis not present

## 2022-02-05 DIAGNOSIS — D492 Neoplasm of unspecified behavior of bone, soft tissue, and skin: Secondary | ICD-10-CM | POA: Diagnosis not present

## 2022-09-02 DIAGNOSIS — L720 Epidermal cyst: Secondary | ICD-10-CM | POA: Diagnosis not present

## 2022-09-02 DIAGNOSIS — L821 Other seborrheic keratosis: Secondary | ICD-10-CM | POA: Diagnosis not present

## 2022-12-02 DIAGNOSIS — I10 Essential (primary) hypertension: Secondary | ICD-10-CM | POA: Diagnosis not present

## 2022-12-02 DIAGNOSIS — Z0000 Encounter for general adult medical examination without abnormal findings: Secondary | ICD-10-CM | POA: Diagnosis not present

## 2022-12-02 DIAGNOSIS — M109 Gout, unspecified: Secondary | ICD-10-CM | POA: Diagnosis not present

## 2022-12-02 DIAGNOSIS — Z23 Encounter for immunization: Secondary | ICD-10-CM | POA: Diagnosis not present

## 2022-12-02 DIAGNOSIS — G894 Chronic pain syndrome: Secondary | ICD-10-CM | POA: Diagnosis not present

## 2022-12-02 DIAGNOSIS — R42 Dizziness and giddiness: Secondary | ICD-10-CM | POA: Diagnosis not present

## 2022-12-02 DIAGNOSIS — E559 Vitamin D deficiency, unspecified: Secondary | ICD-10-CM | POA: Diagnosis not present

## 2022-12-02 DIAGNOSIS — R7303 Prediabetes: Secondary | ICD-10-CM | POA: Diagnosis not present

## 2022-12-02 DIAGNOSIS — N183 Chronic kidney disease, stage 3 unspecified: Secondary | ICD-10-CM | POA: Diagnosis not present

## 2022-12-02 DIAGNOSIS — K219 Gastro-esophageal reflux disease without esophagitis: Secondary | ICD-10-CM | POA: Diagnosis not present

## 2023-01-06 DIAGNOSIS — H47392 Other disorders of optic disc, left eye: Secondary | ICD-10-CM | POA: Diagnosis not present

## 2023-01-06 DIAGNOSIS — Z961 Presence of intraocular lens: Secondary | ICD-10-CM | POA: Diagnosis not present

## 2023-01-06 DIAGNOSIS — H2512 Age-related nuclear cataract, left eye: Secondary | ICD-10-CM | POA: Diagnosis not present

## 2023-01-06 DIAGNOSIS — H40013 Open angle with borderline findings, low risk, bilateral: Secondary | ICD-10-CM | POA: Diagnosis not present

## 2023-03-06 DIAGNOSIS — E039 Hypothyroidism, unspecified: Secondary | ICD-10-CM | POA: Diagnosis not present

## 2023-03-06 DIAGNOSIS — R945 Abnormal results of liver function studies: Secondary | ICD-10-CM | POA: Diagnosis not present

## 2023-03-06 DIAGNOSIS — E781 Pure hyperglyceridemia: Secondary | ICD-10-CM | POA: Diagnosis not present

## 2023-06-01 DIAGNOSIS — R42 Dizziness and giddiness: Secondary | ICD-10-CM | POA: Diagnosis not present

## 2023-06-01 DIAGNOSIS — E559 Vitamin D deficiency, unspecified: Secondary | ICD-10-CM | POA: Diagnosis not present

## 2023-06-01 DIAGNOSIS — R7303 Prediabetes: Secondary | ICD-10-CM | POA: Diagnosis not present

## 2023-06-01 DIAGNOSIS — G894 Chronic pain syndrome: Secondary | ICD-10-CM | POA: Diagnosis not present

## 2023-06-01 DIAGNOSIS — N183 Chronic kidney disease, stage 3 unspecified: Secondary | ICD-10-CM | POA: Diagnosis not present

## 2023-06-01 DIAGNOSIS — M109 Gout, unspecified: Secondary | ICD-10-CM | POA: Diagnosis not present

## 2023-06-01 DIAGNOSIS — E039 Hypothyroidism, unspecified: Secondary | ICD-10-CM | POA: Diagnosis not present

## 2023-06-01 DIAGNOSIS — I10 Essential (primary) hypertension: Secondary | ICD-10-CM | POA: Diagnosis not present

## 2023-06-01 DIAGNOSIS — K219 Gastro-esophageal reflux disease without esophagitis: Secondary | ICD-10-CM | POA: Diagnosis not present

## 2023-12-09 DIAGNOSIS — K219 Gastro-esophageal reflux disease without esophagitis: Secondary | ICD-10-CM | POA: Diagnosis not present

## 2023-12-09 DIAGNOSIS — M109 Gout, unspecified: Secondary | ICD-10-CM | POA: Diagnosis not present

## 2023-12-09 DIAGNOSIS — E559 Vitamin D deficiency, unspecified: Secondary | ICD-10-CM | POA: Diagnosis not present

## 2023-12-09 DIAGNOSIS — E039 Hypothyroidism, unspecified: Secondary | ICD-10-CM | POA: Diagnosis not present

## 2023-12-09 DIAGNOSIS — M545 Low back pain, unspecified: Secondary | ICD-10-CM | POA: Diagnosis not present

## 2023-12-09 DIAGNOSIS — N183 Chronic kidney disease, stage 3 unspecified: Secondary | ICD-10-CM | POA: Diagnosis not present

## 2023-12-09 DIAGNOSIS — R42 Dizziness and giddiness: Secondary | ICD-10-CM | POA: Diagnosis not present

## 2023-12-09 DIAGNOSIS — Z0000 Encounter for general adult medical examination without abnormal findings: Secondary | ICD-10-CM | POA: Diagnosis not present

## 2023-12-09 DIAGNOSIS — Z720 Tobacco use: Secondary | ICD-10-CM | POA: Diagnosis not present

## 2023-12-09 DIAGNOSIS — I10 Essential (primary) hypertension: Secondary | ICD-10-CM | POA: Diagnosis not present

## 2023-12-09 DIAGNOSIS — R7303 Prediabetes: Secondary | ICD-10-CM | POA: Diagnosis not present

## 2024-01-19 DIAGNOSIS — H40013 Open angle with borderline findings, low risk, bilateral: Secondary | ICD-10-CM | POA: Diagnosis not present

## 2024-01-19 DIAGNOSIS — H47392 Other disorders of optic disc, left eye: Secondary | ICD-10-CM | POA: Diagnosis not present

## 2024-01-19 DIAGNOSIS — Z961 Presence of intraocular lens: Secondary | ICD-10-CM | POA: Diagnosis not present

## 2024-01-19 DIAGNOSIS — H2512 Age-related nuclear cataract, left eye: Secondary | ICD-10-CM | POA: Diagnosis not present

## 2024-03-11 DIAGNOSIS — M25552 Pain in left hip: Secondary | ICD-10-CM | POA: Diagnosis not present

## 2024-04-13 DIAGNOSIS — M25552 Pain in left hip: Secondary | ICD-10-CM | POA: Diagnosis not present

## 2024-04-13 DIAGNOSIS — M5416 Radiculopathy, lumbar region: Secondary | ICD-10-CM | POA: Diagnosis not present

## 2024-05-05 DIAGNOSIS — M5416 Radiculopathy, lumbar region: Secondary | ICD-10-CM | POA: Diagnosis not present
